# Patient Record
Sex: Female | Born: 1970 | Race: Black or African American | Hispanic: No | Marital: Married | Smoking: Never smoker
Health system: Southern US, Community
[De-identification: ages and names within clinical notes are randomized; demographics above are authoritative.]

## PROBLEM LIST (undated history)

## (undated) DIAGNOSIS — I10 Essential (primary) hypertension: Secondary | ICD-10-CM

## (undated) DIAGNOSIS — M329 Systemic lupus erythematosus, unspecified: Secondary | ICD-10-CM

## (undated) DIAGNOSIS — IMO0002 Reserved for concepts with insufficient information to code with codable children: Secondary | ICD-10-CM

## (undated) DIAGNOSIS — K219 Gastro-esophageal reflux disease without esophagitis: Secondary | ICD-10-CM

## (undated) HISTORY — PX: UTERINE FIBROID SURGERY: SHX826

## (undated) HISTORY — PX: CHOLECYSTECTOMY: SHX55

## (undated) HISTORY — PX: TONSILLECTOMY: SUR1361

---

## 2000-11-15 ENCOUNTER — Inpatient Hospital Stay (HOSPITAL_COMMUNITY): Admission: AD | Admit: 2000-11-15 | Discharge: 2000-11-15 | Payer: Self-pay | Admitting: Obstetrics & Gynecology

## 2009-04-23 ENCOUNTER — Ambulatory Visit (HOSPITAL_COMMUNITY): Admission: RE | Admit: 2009-04-23 | Discharge: 2009-04-23 | Payer: Self-pay | Admitting: Obstetrics and Gynecology

## 2009-05-24 ENCOUNTER — Ambulatory Visit (HOSPITAL_COMMUNITY): Admission: RE | Admit: 2009-05-24 | Discharge: 2009-05-24 | Payer: Self-pay | Admitting: Obstetrics and Gynecology

## 2009-06-18 ENCOUNTER — Ambulatory Visit (HOSPITAL_COMMUNITY): Admission: RE | Admit: 2009-06-18 | Discharge: 2009-06-18 | Payer: Self-pay | Admitting: Obstetrics and Gynecology

## 2009-07-16 ENCOUNTER — Ambulatory Visit (HOSPITAL_COMMUNITY): Admission: RE | Admit: 2009-07-16 | Discharge: 2009-07-16 | Payer: Self-pay | Admitting: Obstetrics and Gynecology

## 2009-08-16 ENCOUNTER — Ambulatory Visit (HOSPITAL_COMMUNITY): Admission: RE | Admit: 2009-08-16 | Discharge: 2009-08-16 | Payer: Self-pay | Admitting: Obstetrics and Gynecology

## 2009-09-13 ENCOUNTER — Ambulatory Visit (HOSPITAL_COMMUNITY): Admission: RE | Admit: 2009-09-13 | Discharge: 2009-09-13 | Payer: Self-pay | Admitting: Obstetrics and Gynecology

## 2009-10-10 ENCOUNTER — Ambulatory Visit (HOSPITAL_COMMUNITY): Admission: RE | Admit: 2009-10-10 | Discharge: 2009-10-10 | Payer: Self-pay | Admitting: Obstetrics and Gynecology

## 2009-10-31 ENCOUNTER — Inpatient Hospital Stay (HOSPITAL_COMMUNITY): Admission: RE | Admit: 2009-10-31 | Discharge: 2009-11-03 | Payer: Self-pay | Admitting: Obstetrics and Gynecology

## 2009-10-31 ENCOUNTER — Encounter: Payer: Self-pay | Admitting: Obstetrics and Gynecology

## 2010-05-08 LAB — COMPREHENSIVE METABOLIC PANEL
ALT: 35 U/L (ref 0–35)
AST: 38 U/L — ABNORMAL HIGH (ref 0–37)
Albumin: 2.5 g/dL — ABNORMAL LOW (ref 3.5–5.2)
Albumin: 3 g/dL — ABNORMAL LOW (ref 3.5–5.2)
CO2: 22 mEq/L (ref 19–32)
CO2: 24 mEq/L (ref 19–32)
Calcium: 9.4 mg/dL (ref 8.4–10.5)
Chloride: 109 mEq/L (ref 96–112)
Creatinine, Ser: 0.73 mg/dL (ref 0.4–1.2)
GFR calc Af Amer: >60 mL/min (ref >60)
Glucose, Bld: 76 mg/dL (ref 70–99)
Glucose, Bld: 83 mg/dL (ref 70–99)
Potassium: 4.3 mEq/L (ref 3.5–5.1)
Sodium: 135 mEq/L (ref 135–145)
Total Bilirubin: 0.6 mg/dL (ref 0.3–1.2)

## 2010-05-08 LAB — CBC
HCT: 31.6 % — ABNORMAL LOW (ref 36.0–46.0)
Hemoglobin: 11.2 g/dL — ABNORMAL LOW (ref 12.0–15.0)
Hemoglobin: 8.9 g/dL — ABNORMAL LOW (ref 12.0–15.0)
MCH: 32.7 pg (ref 26.0–34.0)
MCHC: 34.4 g/dL (ref 30.0–36.0)
MCV: 93.6 fL (ref 78.0–100.0)
MCV: 93.7 fL (ref 78.0–100.0)
MCV: 94.3 fL (ref 78.0–100.0)
MCV: 95.1 fL (ref 78.0–100.0)
Platelets: 160 10*3/uL (ref 150–400)
Platelets: 169 10*3/uL (ref 150–400)
RBC: 2.71 MIL/uL — ABNORMAL LOW (ref 3.87–5.11)
RBC: 3.37 MIL/uL — ABNORMAL LOW (ref 3.87–5.11)
RDW: 14.8 % (ref 11.5–15.5)
WBC: 5.3 10*3/uL (ref 4.0–10.5)

## 2010-05-08 LAB — URIC ACID: Uric Acid, Serum: 5.7 mg/dL (ref 2.4–7.0)

## 2010-05-08 LAB — BASIC METABOLIC PANEL
Calcium: 8.8 mg/dL (ref 8.4–10.5)
Creatinine, Ser: 0.61 mg/dL (ref 0.4–1.2)
GFR calc Af Amer: >60 mL/min (ref >60)
GFR calc non Af Amer: >60 mL/min (ref >60)

## 2010-05-08 LAB — LACTATE DEHYDROGENASE: LDH: 158 U/L (ref 94–250)

## 2011-01-23 ENCOUNTER — Other Ambulatory Visit (HOSPITAL_COMMUNITY)
Admission: RE | Admit: 2011-01-23 | Discharge: 2011-01-23 | Disposition: A | Discharge status: Home / Self Care | Payer: BC Managed Care – PPO | Source: Ambulatory Visit | Attending: Obstetrics and Gynecology | Admitting: Obstetrics and Gynecology

## 2011-01-23 ENCOUNTER — Other Ambulatory Visit: Payer: Self-pay | Admitting: Obstetrics and Gynecology

## 2011-01-23 DIAGNOSIS — Z124 Encounter for screening for malignant neoplasm of cervix: Secondary | ICD-10-CM | POA: Insufficient documentation

## 2011-01-23 DIAGNOSIS — Z1159 Encounter for screening for other viral diseases: Secondary | ICD-10-CM | POA: Insufficient documentation

## 2011-06-05 IMAGING — US US OB FOLLOW-UP
2 series · 14 of 28 positions shown · non-contrast
Comparison: none

OBSTETRICAL ULTRASOUND:
 This ultrasound was performed in The [HOSPITAL], and the [facility] report will be stored to [REDACTED] PACS.  This report is also available in [HOSPITAL]?s accessANYware.

[Series 1: us ob follow-up · 11 of 28 slices shown (1 of 2)]
[im 2/28]
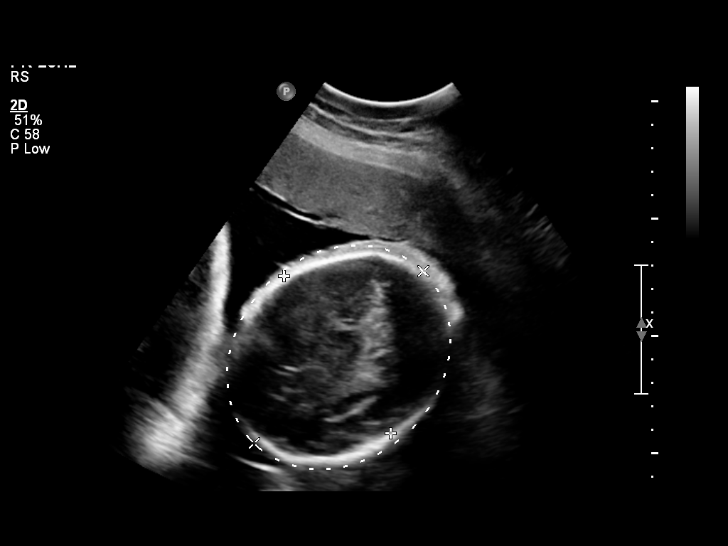
[im 4/28]
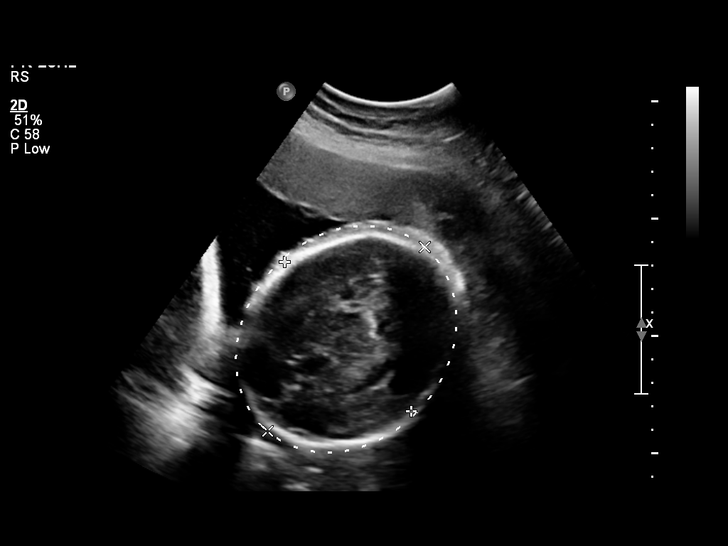
[im 7/28]
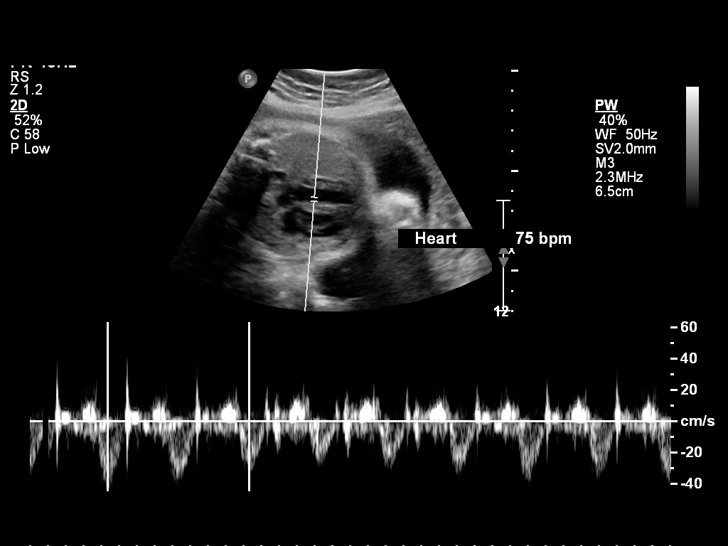
[im 10/28]
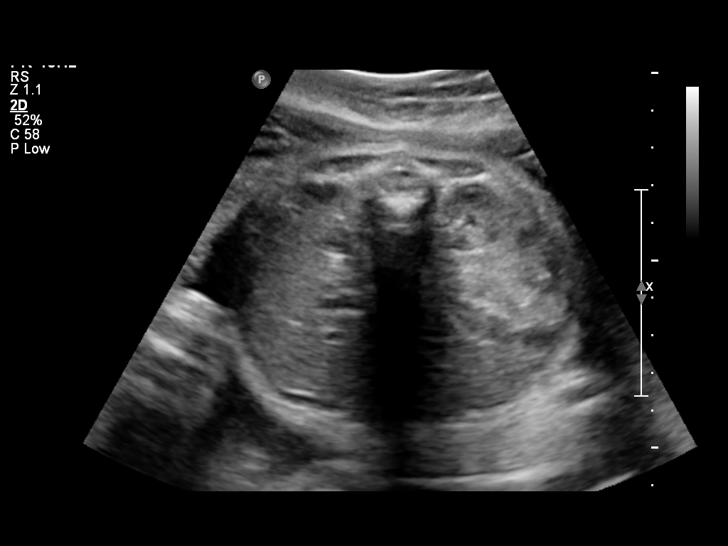
[im 12/28]
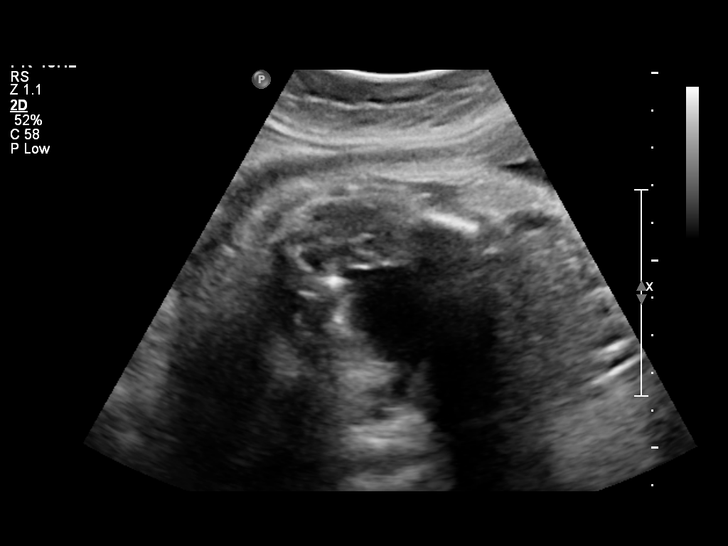
[im 15/28]
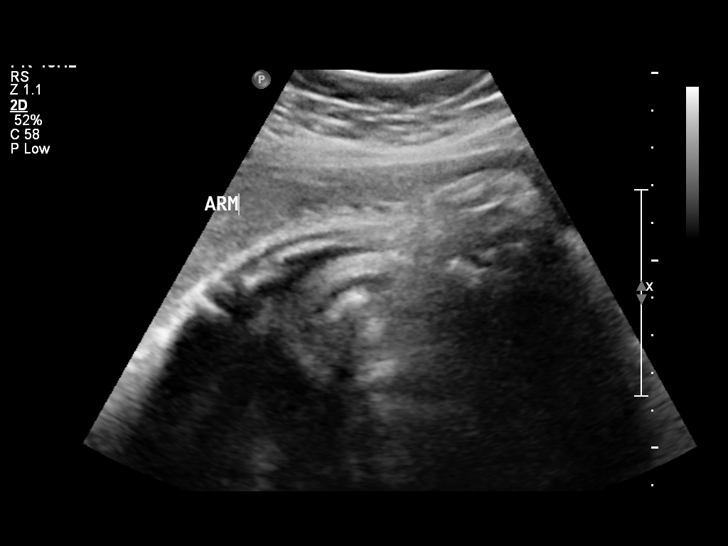
[im 17/28]
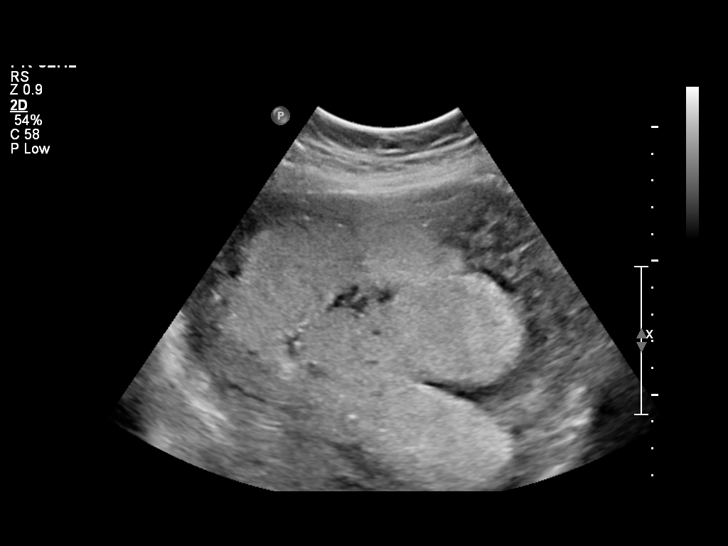
[im 20/28]
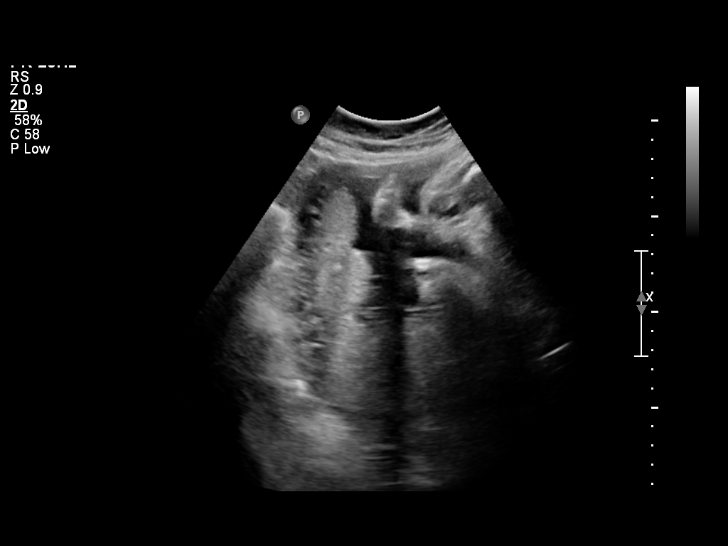
[im 22/28]
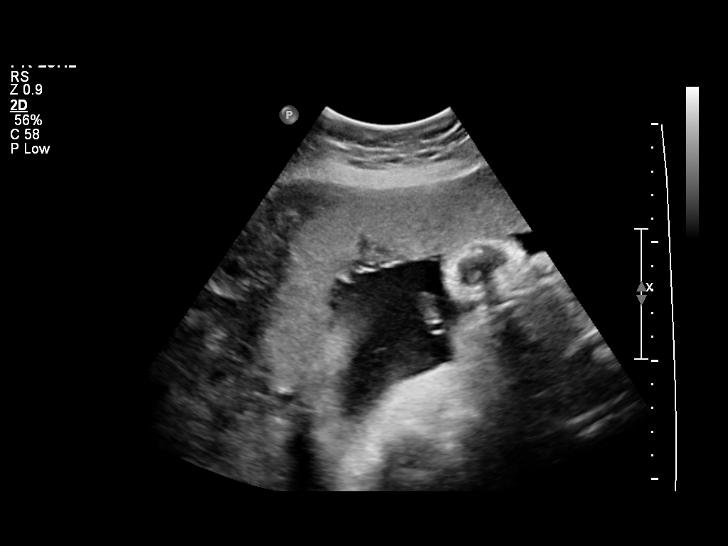
[im 25/28]
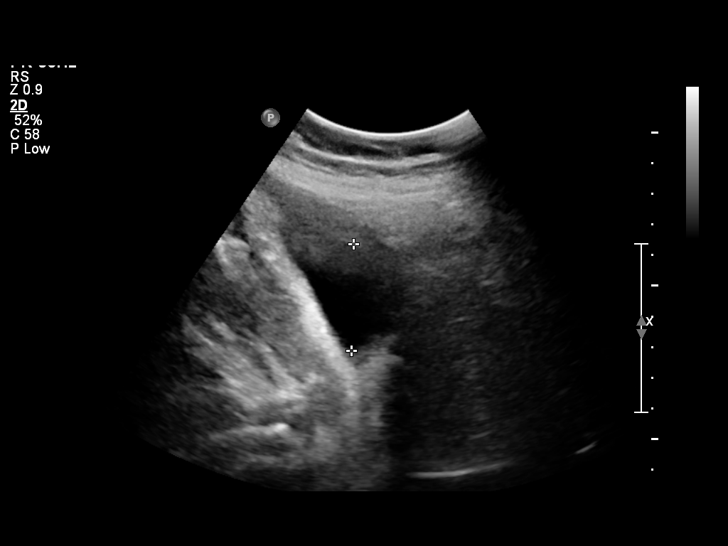
[im 28/28]
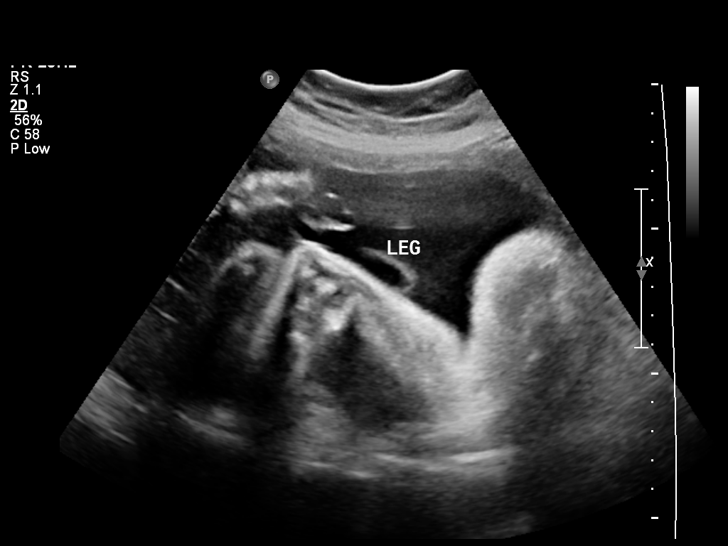

[Series 1: us ob follow-up · 3 of 8 slices shown (2 of 2)]
[im 2/8]
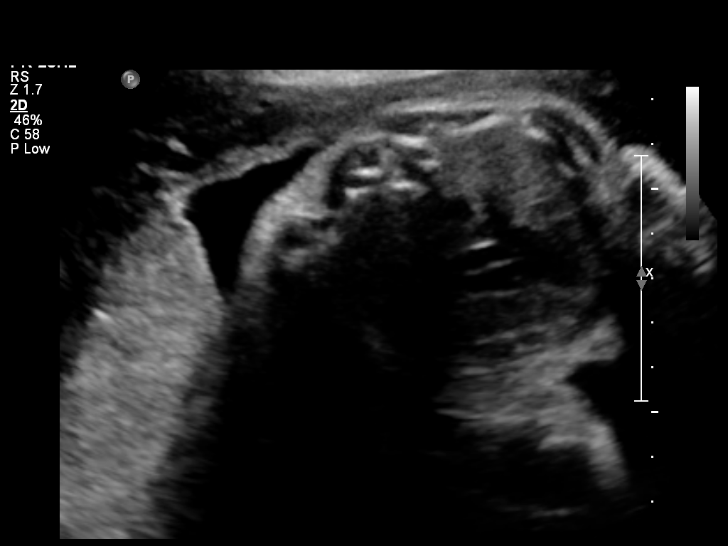
[im 5/8]
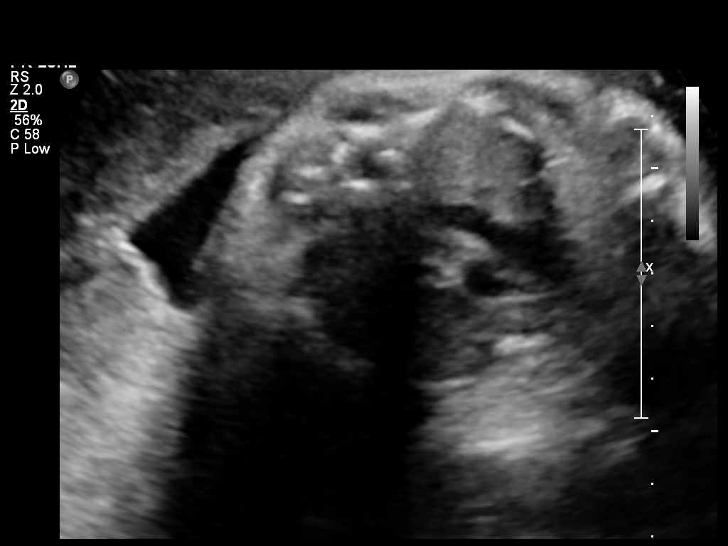
[im 8/8]
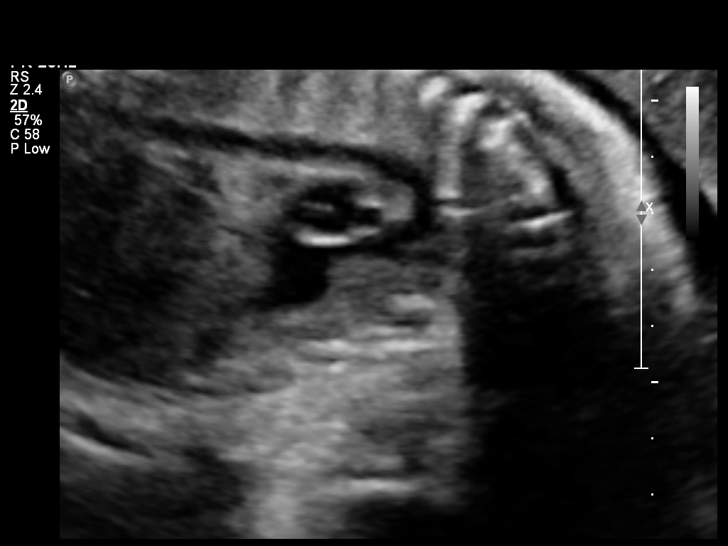

[14 of 28 positions shown; findings below may reference images not displayed]

IMPRESSION: [facility] has also been faxed to the ordering physician.

## 2013-02-15 ENCOUNTER — Other Ambulatory Visit (HOSPITAL_COMMUNITY)
Admission: RE | Admit: 2013-02-15 | Discharge: 2013-02-15 | Disposition: A | Discharge status: Home / Self Care | Payer: BC Managed Care – PPO | Source: Ambulatory Visit | Attending: Obstetrics and Gynecology | Admitting: Obstetrics and Gynecology

## 2013-02-15 ENCOUNTER — Other Ambulatory Visit: Payer: Self-pay | Admitting: Obstetrics and Gynecology

## 2013-02-15 DIAGNOSIS — Z01419 Encounter for gynecological examination (general) (routine) without abnormal findings: Secondary | ICD-10-CM | POA: Insufficient documentation

## 2015-06-26 ENCOUNTER — Other Ambulatory Visit: Payer: Self-pay | Admitting: Obstetrics and Gynecology

## 2015-06-26 ENCOUNTER — Other Ambulatory Visit (HOSPITAL_COMMUNITY)
Admission: RE | Admit: 2015-06-26 | Discharge: 2015-06-26 | Disposition: A | Discharge status: Home / Self Care | Payer: BC Managed Care – PPO | Source: Ambulatory Visit | Attending: Obstetrics and Gynecology | Admitting: Obstetrics and Gynecology

## 2015-06-26 DIAGNOSIS — Z01419 Encounter for gynecological examination (general) (routine) without abnormal findings: Secondary | ICD-10-CM | POA: Diagnosis present

## 2015-06-26 DIAGNOSIS — Z1151 Encounter for screening for human papillomavirus (HPV): Secondary | ICD-10-CM | POA: Diagnosis not present

## 2015-06-27 LAB — CYTOLOGY - PAP

## 2015-07-12 ENCOUNTER — Encounter (HOSPITAL_BASED_OUTPATIENT_CLINIC_OR_DEPARTMENT_OTHER): Payer: Self-pay | Admitting: *Deleted

## 2015-07-12 DIAGNOSIS — F410 Panic disorder [episodic paroxysmal anxiety] without agoraphobia: Secondary | ICD-10-CM | POA: Diagnosis present

## 2015-07-12 DIAGNOSIS — Z79899 Other long term (current) drug therapy: Secondary | ICD-10-CM | POA: Insufficient documentation

## 2015-07-12 DIAGNOSIS — I10 Essential (primary) hypertension: Secondary | ICD-10-CM | POA: Insufficient documentation

## 2015-07-12 DIAGNOSIS — F419 Anxiety disorder, unspecified: Secondary | ICD-10-CM | POA: Insufficient documentation

## 2015-07-12 NOTE — ED Notes (Signed)
States she is have anxiety and loss of appetite. She is not sleeping. Denies SI.

## 2015-07-13 ENCOUNTER — Emergency Department (HOSPITAL_BASED_OUTPATIENT_CLINIC_OR_DEPARTMENT_OTHER)
Admission: EM | Admit: 2015-07-13 | Discharge: 2015-07-13 | Disposition: A | Discharge status: Home / Self Care | Payer: BC Managed Care – PPO | Attending: Emergency Medicine | Admitting: Emergency Medicine

## 2015-07-13 ENCOUNTER — Encounter (HOSPITAL_BASED_OUTPATIENT_CLINIC_OR_DEPARTMENT_OTHER): Payer: Self-pay | Admitting: Emergency Medicine

## 2015-07-13 DIAGNOSIS — F419 Anxiety disorder, unspecified: Secondary | ICD-10-CM

## 2015-07-13 HISTORY — DX: Gastro-esophageal reflux disease without esophagitis: K21.9

## 2015-07-13 HISTORY — DX: Reserved for concepts with insufficient information to code with codable children: IMO0002

## 2015-07-13 HISTORY — DX: Essential (primary) hypertension: I10

## 2015-07-13 HISTORY — DX: Systemic lupus erythematosus, unspecified: M32.9

## 2015-07-13 MED ORDER — LORAZEPAM 1 MG PO TABS
1.0000 mg | ORAL_TABLET | Freq: Once | ORAL | Status: AC
  Administered 2015-07-13: 1 mg via ORAL
  Filled 2015-07-13: qty 1

## 2015-07-13 MED ORDER — LORAZEPAM 1 MG PO TABS: 1.0000 mg | ORAL_TABLET | Freq: Three times a day (TID) | ORAL | Status: AC | PRN

## 2015-07-13 NOTE — ED Provider Notes (Signed)
CSN: [id number]     Arrival date & time 07/12/15  1959 History   First MD Initiated Contact with Patient 07/13/15 0150     Chief Complaint  Patient presents with  . Panic Attack     (Consider location/radiation/quality/duration/timing/severity/associated sxs/prior Treatment) HPI  This is a 45 year old female with a history of lupus. She is here with a one-month history of increasing anxiety. She relates this to stress at work. She has had associated decreased appetite, difficulty sleeping and body aches. She denies suicidal ideation. She is not sure if she feels depressed. She is not taking any medications for her symptoms. She feels edgy and wants something to help her calm down. Symptoms are moderate.  Past Medical History  Diagnosis Date  . Hypertension   . GERD (gastroesophageal reflux disease)    Past Surgical History  Procedure Laterality Date  . Uterine fibroid surgery    . Tonsillectomy     No family history on file. Social History  Substance Use Topics  . Smoking status: Never Smoker   . Smokeless tobacco: None  . Alcohol Use: No   OB History    No data available     Review of Systems  All other systems reviewed and are negative.   Allergies  Imodium  Home Medications   Prior to Admission medications   Medication Sig Start Date End Date Taking? Authorizing Provider  LISINOPRIL PO Take by mouth.   Yes Historical Provider, MD  METOPROLOL TARTRATE PO Take by mouth.   Yes Historical Provider, MD  Omeprazole (PRILOSEC PO) Take by mouth.   Yes Historical Provider, MD   BP 146/90 mmHg  Pulse 76  Temp(Src) 98.8 F (37.1 C) (Oral)  Resp 18  Ht 5\' 4"  (1.626 m)  Wt 170 lb (77.111 kg)  BMI 29.17 kg/m2  SpO2 100%   Physical Exam  General: Well-developed, well-nourished female in no acute distress; appearance consistent with age of record HENT: normocephalic; atraumatic Eyes: pupils equal, round and reactive to light; extraocular muscles intact Neck:  supple Heart: regular rate and rhythm Lungs: clear to auscultation bilaterally Abdomen: soft; nondistended; nontender; bowel sounds present Extremities: No deformity; full range of motion; pulses normal Neurologic: Awake, alert and oriented; motor function intact in all extremities and symmetric; no facial droop Skin: Warm and dry Psychiatric: Anxious; no SI    ED Course  Procedures (including critical care time)   MDM      Paula LibraJohn Tarris Delbene, MD 07/13/15 [id number]

## 2018-11-15 ENCOUNTER — Other Ambulatory Visit (HOSPITAL_COMMUNITY)
Admission: RE | Admit: 2018-11-15 | Discharge: 2018-11-15 | Disposition: A | Discharge status: Home / Self Care | Payer: BC Managed Care – PPO | Source: Ambulatory Visit | Attending: Obstetrics and Gynecology | Admitting: Obstetrics and Gynecology

## 2018-11-15 ENCOUNTER — Other Ambulatory Visit: Payer: Self-pay | Admitting: Obstetrics and Gynecology

## 2018-11-15 DIAGNOSIS — Z124 Encounter for screening for malignant neoplasm of cervix: Secondary | ICD-10-CM | POA: Insufficient documentation

## 2018-11-17 LAB — CYTOLOGY - PAP
Adequacy: ABSENT
Diagnosis: NEGATIVE
High risk HPV: NEGATIVE
Molecular Disclaimer: 56
Molecular Disclaimer: DETECTED
Molecular Disclaimer: NORMAL

## 2019-04-27 ENCOUNTER — Ambulatory Visit: Payer: BC Managed Care – PPO | Attending: Family

## 2019-04-27 DIAGNOSIS — Z23 Encounter for immunization: Secondary | ICD-10-CM

## 2019-04-27 NOTE — Progress Notes (Signed)
   Covid-19 Vaccination Clinic  Name:  Zadia Uhde Brewington-McCormick    MRN: [id number] DOB: Dec 25, 1970  04/27/2019  Ms. Brewington-McCormick was observed post Covid-19 immunization for 15 minutes without incident. She was provided with Vaccine Information Sheet and instruction to access the V-Safe system.   Ms. Brewington-McCormick was instructed to call 911 with any severe reactions post vaccine: Marland Kitchen Difficulty breathing  . Swelling of face and throat  . A fast heartbeat  . A bad rash all over body  . Dizziness and weakness   Immunizations Administered    Name Date Dose VIS Date Route   Moderna COVID-19 Vaccine 04/27/2019  3:04 PM 0.5 mL 01/24/2019 Intramuscular   Manufacturer: Moderna   Lot: [id number]   NDC: 44171-278-71

## 2019-05-30 ENCOUNTER — Ambulatory Visit: Payer: BC Managed Care – PPO | Attending: Family

## 2019-05-30 DIAGNOSIS — Z23 Encounter for immunization: Secondary | ICD-10-CM

## 2019-05-30 NOTE — Progress Notes (Signed)
   Covid-19 Vaccination Clinic  Name:  Jackelyn Illingworth Brewington-McCormick    MRN: [id number] DOB: [birth year]  05/30/2019  Ms. Brewington-McCormick was observed post Covid-19 immunization for 15 minutes without incident. She was provided with Vaccine Information Sheet and instruction to access the V-Safe system.   Ms. Brewington-McCormick was instructed to call 911 with any severe reactions post vaccine: Marland Kitchen Difficulty breathing  . Swelling of face and throat  . A fast heartbeat  . A bad rash all over body  . Dizziness and weakness   Immunizations Administered    Name Date Dose VIS Date Route   Moderna COVID-19 Vaccine 05/30/2019 11:04 AM 0.5 mL 01/24/2019 Intramuscular   Manufacturer: Moderna   Lot: [id number]   NDC: 31517-616-07

## 2020-03-01 ENCOUNTER — Other Ambulatory Visit (HOSPITAL_BASED_OUTPATIENT_CLINIC_OR_DEPARTMENT_OTHER): Payer: Self-pay | Admitting: Internal Medicine

## 2020-03-01 ENCOUNTER — Ambulatory Visit: Payer: Self-pay | Attending: Internal Medicine

## 2020-03-01 DIAGNOSIS — Z23 Encounter for immunization: Secondary | ICD-10-CM

## 2020-03-01 NOTE — Progress Notes (Signed)
   Covid-19 Vaccination Clinic  Name:  Heidi Ingram    MRN: [id number] DOB: [birth year]  03/01/2020  [name] was observed post Covid-19 immunization for 15 minutes without incident. She was provided with Vaccine Information Sheet and instruction to access the V-Safe system.   [name] was instructed to call 911 with any severe reactions post vaccine: Marland Kitchen Difficulty breathing  . Swelling of face and throat  . A fast heartbeat  . A bad rash all over body  . Dizziness and weakness   Immunizations Administered    Name Date Dose VIS Date Route   Moderna Covid-19 Booster Vaccine 03/01/2020 12:44 PM 0.25 mL 12/13/2019 Intramuscular   Manufacturer: Gala Murdoch   Lot: [id number]   NDC: 15830-940-76

## 2020-07-24 ENCOUNTER — Ambulatory Visit: Payer: Self-pay | Attending: Internal Medicine

## 2020-07-24 DIAGNOSIS — Z23 Encounter for immunization: Secondary | ICD-10-CM

## 2020-07-24 NOTE — Progress Notes (Signed)
   Covid-19 Vaccination Clinic  Name:  Heidi Ingram    MRN: [id number] DOB: 10/26/1970  07/24/2020  [name] was observed post Covid-19 immunization for 15 minutes without incident. She was provided with Vaccine Information Sheet and instruction to access the V-Safe system.   [name] was instructed to call 911 with any severe reactions post vaccine: Marland Kitchen Difficulty breathing  . Swelling of face and throat  . A fast heartbeat  . A bad rash all over body  . Dizziness and weakness   Immunizations Administered    Name Date Dose VIS Date Route   Moderna Covid-19 Booster Vaccine 07/24/2020  2:00 PM 0.25 mL 12/13/2019 Intramuscular   Manufacturer: Moderna   Lot: [id number]   NDC: 37858-850-27

## 2020-07-29 ENCOUNTER — Other Ambulatory Visit (HOSPITAL_BASED_OUTPATIENT_CLINIC_OR_DEPARTMENT_OTHER): Payer: Self-pay

## 2020-07-29 MED ORDER — MODERNA COVID-19 VACCINE 100 MCG/0.5ML IM SUSP
INTRAMUSCULAR | 0 refills | Status: AC
  Filled 2020-07-29: qty 0.25, 1d supply, fill #0

## 2021-01-31 ENCOUNTER — Ambulatory Visit: Payer: Self-pay | Attending: Internal Medicine

## 2021-01-31 ENCOUNTER — Other Ambulatory Visit (HOSPITAL_BASED_OUTPATIENT_CLINIC_OR_DEPARTMENT_OTHER): Payer: Self-pay

## 2021-01-31 DIAGNOSIS — Z23 Encounter for immunization: Secondary | ICD-10-CM

## 2021-01-31 MED ORDER — MODERNA COVID-19 BIVAL BOOSTER 50 MCG/0.5ML IM SUSP
INTRAMUSCULAR | 0 refills | Status: AC
  Filled 2021-01-31: qty 0.5, 1d supply, fill #0

## 2021-01-31 NOTE — Progress Notes (Signed)
   Covid-19 Vaccination Clinic  Name:  Heidi Ingram    MRN: [id number] DOB: 05-12-1970  01/31/2021  [name] was observed post Covid-19 immunization for 15 minutes without incident. She was provided with Vaccine Information Sheet and instruction to access the V-Safe system.   [name] was instructed to call 911 with any severe reactions post vaccine: Difficulty breathing  Swelling of face and throat  A fast heartbeat  A bad rash all over body  Dizziness and weakness   Immunizations Administered     Name Date Dose VIS Date Route   Moderna Covid-19 vaccine Bivalent Booster 01/31/2021 12:17 PM 0.5 mL 10/05/2020 Intramuscular   Manufacturer: Gala Murdoch   Lot: [id number]   NDC: 67591-638-46

## 2021-03-01 ENCOUNTER — Encounter (HOSPITAL_BASED_OUTPATIENT_CLINIC_OR_DEPARTMENT_OTHER): Payer: Self-pay | Admitting: Emergency Medicine

## 2021-03-01 ENCOUNTER — Other Ambulatory Visit: Payer: Self-pay

## 2021-03-01 ENCOUNTER — Emergency Department (HOSPITAL_BASED_OUTPATIENT_CLINIC_OR_DEPARTMENT_OTHER)
Admission: EM | Admit: 2021-03-01 | Discharge: 2021-03-01 | Disposition: A | Discharge status: Home / Self Care | Payer: Commercial Managed Care - PPO | Attending: Emergency Medicine | Admitting: Emergency Medicine

## 2021-03-01 DIAGNOSIS — Z79899 Other long term (current) drug therapy: Secondary | ICD-10-CM | POA: Diagnosis not present

## 2021-03-01 DIAGNOSIS — J0110 Acute frontal sinusitis, unspecified: Secondary | ICD-10-CM | POA: Diagnosis not present

## 2021-03-01 DIAGNOSIS — Z20822 Contact with and (suspected) exposure to covid-19: Secondary | ICD-10-CM | POA: Diagnosis not present

## 2021-03-01 DIAGNOSIS — R0981 Nasal congestion: Secondary | ICD-10-CM | POA: Diagnosis present

## 2021-03-01 LAB — RESP PANEL BY RT-PCR (FLU A&B, COVID) ARPGX2
Influenza A by PCR: NEGATIVE
Influenza B by PCR: NEGATIVE
SARS Coronavirus 2 by RT PCR: NEGATIVE

## 2021-03-01 MED ORDER — AMOXICILLIN-POT CLAVULANATE 875-125 MG PO TABS: 1.0000 | ORAL_TABLET | Freq: Two times a day (BID) | ORAL | 0 refills | Status: AC

## 2021-03-01 NOTE — ED Triage Notes (Signed)
Pt arrives pov with c/o congestion, HA, sore throat x 1 week. Tylenol at 1000 today

## 2021-03-01 NOTE — ED Provider Notes (Signed)
[facility] EMERGENCY DEPARTMENT Provider Note   CSN: [id number] Arrival date & time: 03/01/21  1514     History  Chief Complaint  Patient presents with   Nasal Congestion    Heidi Ingram is a 51 y.o. female. Patient presents with 1 week of sinus congestion and headaches.  She has now developed an associated cough and has some associated chest tightness with the cough.  She has been trying nasal sprays, Sudafed, and over-the-counter cough and cold medication with no relief.  She has not had any fevers. HPI     Home Medications Prior to Admission medications   Medication Sig Start Date End Date Taking? Authorizing Provider  amoxicillin-clavulanate (AUGMENTIN) 875-125 MG tablet Take 1 tablet by mouth every 12 (twelve) hours for 5 days. 03/01/21 03/06/21 Yes Leanord Thibeau, Finis Bud, PA-C  COVID-19 mRNA bivalent vaccine, Moderna, (MODERNA COVID-19 BIVAL BOOSTER) 50 MCG/0.5ML injection Inject into the muscle. 01/31/21   Judyann Munson, MD  COVID-19 mRNA vaccine, Moderna, (MODERNA COVID-19 VACCINE) 100 MCG/0.5ML injection Inject into the muscle. 07/24/20   Judyann Munson, MD  COVID-19 mRNA vaccine, Moderna, 100 MCG/0.5ML injection INJECT AS DIRECTED 03/01/20 03/01/21  Judyann Munson, MD  LISINOPRIL PO Take by mouth.    [provider]  LORazepam (ATIVAN) 1 MG tablet Take 1 tablet (1 mg total) by mouth 3 (three) times daily as needed for anxiety. 07/13/15   Molpus, John, MD  METOPROLOL TARTRATE PO Take by mouth.    [provider]  Omeprazole (PRILOSEC PO) Take by mouth.    [provider]      Allergies    Imodium [loperamide]    Review of Systems   Review of Systems  HENT:  Positive for congestion.   Respiratory:  Positive for cough and chest tightness.   All other systems reviewed and are negative.  Physical Exam Updated Vital Signs BP (!) 163/83 (BP Location: Right Arm)    Pulse 86    Temp 98 F (36.7 C) (Oral)    Resp 18    Ht 5\' 7"   (1.702 m)    Wt 77.1 kg    SpO2 95%    BMI 26.63 kg/m  Physical Exam Vitals and nursing note reviewed.  Constitutional:      General: She is not in acute distress.    Appearance: She is not toxic-appearing.  HENT:     Head: Normocephalic and atraumatic.     Right Ear: Hearing, tympanic membrane, ear canal and external ear normal. Tympanic membrane is not injected, perforated or erythematous.     Left Ear: Hearing, tympanic membrane, ear canal and external ear normal. Tympanic membrane is not injected, perforated or erythematous.     Nose: Congestion present.     Right Sinus: Maxillary sinus tenderness and frontal sinus tenderness present.     Left Sinus: Maxillary sinus tenderness and frontal sinus tenderness present.     Mouth/Throat:     Pharynx: Oropharynx is clear. Uvula midline. No pharyngeal swelling, oropharyngeal exudate, posterior oropharyngeal erythema or uvula swelling.     Tonsils: No tonsillar exudate or tonsillar abscesses.  Eyes:     General: No scleral icterus.       Right eye: No discharge.        Left eye: No discharge.  Cardiovascular:     Rate and Rhythm: Normal rate and regular rhythm.     Heart sounds: Normal heart sounds, S1 normal and S2 normal. No murmur heard.   No friction rub.  No gallop. No S3 or S4 sounds.  Pulmonary:     Effort: No respiratory distress.     Breath sounds: Normal breath sounds. No stridor. No wheezing, rhonchi or rales.  Musculoskeletal:     Right lower leg: No edema.     Left lower leg: No edema.  Lymphadenopathy:     Cervical: No cervical adenopathy.  Neurological:     Mental Status: She is alert.  Psychiatric:        Mood and Affect: Mood normal.        Behavior: Behavior normal.    ED Results / Procedures / Treatments   Labs (all labs ordered are listed, but only abnormal results are displayed) Labs Reviewed  RESP PANEL BY RT-PCR (FLU A&B, COVID) [id number]    EKG None  Radiology No results  found.  Procedures Procedures   Medications Ordered in ED Medications - No data to display  ED Course/ Medical Decision Making/ A&P                           Medical Decision Making Problems Addressed: Acute non-recurrent frontal sinusitis: self-limited or minor problem  Amount and/or Complexity of Data Reviewed Labs: ordered. Decision-making details documented in ED Course.  Risk OTC drugs. Prescription drug management.   Patient presents emergency department with 7 days of nasal congestion, headaches, sinus tenderness, associated cough.  Vitals are stable and patient is in no distress. Exam is consistent with uncomplicated acute sinusitis She has tried most outpatient supportive management that I would recommend without relief. At this point, I think it is reasonable to trial antibiotic therapy.  Will prescribe this at home.  Final Clinical Impression(s) / ED Diagnoses Final diagnoses:  Acute non-recurrent frontal sinusitis    Rx / DC Orders ED Discharge Orders          Ordered    amoxicillin-clavulanate (AUGMENTIN) 875-125 MG tablet  Every 12 hours        03/01/21 1645              Safiyyah Vasconez, Finis Bud, PA-C 03/01/21 1652    Virgina Norfolk, DO 03/01/21 1816

## 2021-03-01 NOTE — Discharge Instructions (Addendum)
Please fill prescription at the pharmacy. Please take all abx as prescribed.
